# Patient Record
Sex: Male | Born: 1995 | Race: White | Hispanic: No | Marital: Single | Smoking: Current every day smoker
Health system: Southern US, Community
[De-identification: ages and names within clinical notes are randomized; demographics above are authoritative.]

---

## 2016-11-23 ENCOUNTER — Emergency Department (INDEPENDENT_AMBULATORY_CARE_PROVIDER_SITE_OTHER)
Admission: EM | Admit: 2016-11-23 | Discharge: 2016-11-23 | Disposition: A | Discharge status: Home / Self Care | Payer: Self-pay | Source: Home / Self Care | Attending: Family Medicine | Admitting: Family Medicine

## 2016-11-23 DIAGNOSIS — R5382 Chronic fatigue, unspecified: Secondary | ICD-10-CM

## 2016-11-23 DIAGNOSIS — K529 Noninfective gastroenteritis and colitis, unspecified: Secondary | ICD-10-CM

## 2016-11-23 DIAGNOSIS — R112 Nausea with vomiting, unspecified: Secondary | ICD-10-CM

## 2016-11-23 DIAGNOSIS — R197 Diarrhea, unspecified: Secondary | ICD-10-CM

## 2016-11-23 MED ORDER — ONDANSETRON 4 MG PO TBDP: ORAL_TABLET | ORAL | 0 refills | Status: AC

## 2016-11-23 NOTE — Discharge Instructions (Addendum)
Begin clear liquids (Pedialyte while having diarrhea) until improved, then advance to a BRAT diet (Bananas, Rice, Applesauce, Toast).  Then gradually resume a regular diet when tolerated.  Avoid milk products until well.  ° °If symptoms become significantly worse during the night or over the weekend, proceed to the local emergency room. ° °

## 2016-11-23 NOTE — ED Triage Notes (Signed)
Patient began with nausea and vomiting, and longer term diarrhea at about 0500 today; last episodes at 1000.

## 2016-11-23 NOTE — ED Triage Notes (Signed)
Patient came here after evaluation at [facility] ED. Their only concern and comment was his respirations. He was dissatisfied with his work up.

## 2016-11-24 LAB — COMPLETE METABOLIC PANEL WITH GFR
ALT: 12 U/L (ref 9–46)
AST: 15 U/L (ref 10–40)
Albumin: 4.9 g/dL (ref 3.6–5.1)
Alkaline Phosphatase: 56 U/L (ref 40–115)
BUN: 17 mg/dL (ref 7–25)
CALCIUM: 9.5 mg/dL (ref 8.6–10.3)
CHLORIDE: 102 mmol/L (ref 98–110)
CO2: 25 mmol/L (ref 20–31)
Creat: 0.84 mg/dL (ref 0.60–1.35)
GFR, Est African American: >89 mL/min (ref >=60)
GFR, Est Non African American: >89 mL/min (ref >=60)
GLUCOSE: 83 mg/dL (ref 65–99)
POTASSIUM: 3.5 mmol/L (ref 3.5–5.3)
SODIUM: 140 mmol/L (ref 135–146)
Total Bilirubin: 1.1 mg/dL (ref 0.2–1.2)
Total Protein: 7.3 g/dL (ref 6.1–8.1)

## 2016-11-24 LAB — TSH: TSH: 1.89 m[IU]/L (ref 0.40–4.50)

## 2016-11-24 LAB — HIV ANTIBODY (ROUTINE TESTING W REFLEX): HIV 1&2 Ab, 4th Generation: NONREACTIVE

## 2016-11-24 LAB — CBC WITH DIFFERENTIAL/PLATELET
Basophils Absolute: 0 cells/uL (ref 0–200)
Basophils Relative: 0 %
EOS PCT: 0 %
Eosinophils Absolute: 0 cells/uL — ABNORMAL LOW (ref 15–500)
HCT: 48.5 % (ref 38.5–50.0)
Hemoglobin: 17.1 g/dL (ref 13.2–17.1)
LYMPHS PCT: 14 %
Lymphs Abs: 1400 cells/uL (ref 850–3900)
MCH: 30.5 pg (ref 27.0–33.0)
MCHC: 35.3 g/dL (ref 32.0–36.0)
MCV: 86.5 fL (ref 80.0–100.0)
MPV: 9.8 fL (ref 7.5–12.5)
Monocytes Absolute: 700 cells/uL (ref 200–950)
Monocytes Relative: 7 %
NEUTROS PCT: 79 %
Neutro Abs: 7900 cells/uL — ABNORMAL HIGH (ref 1500–7800)
PLATELETS: 197 10*3/uL (ref 140–400)
RBC: 5.61 MIL/uL (ref 4.20–5.80)
RDW: 13.6 % (ref 11.0–15.0)
WBC: 10 10*3/uL (ref 3.8–10.8)

## 2016-11-24 LAB — SEDIMENTATION RATE: SED RATE: 1 mm/h (ref 0–15)

## 2016-11-25 LAB — GC/CHLAMYDIA PROBE AMP
CT PROBE, AMP APTIMA: NOT DETECTED
GC Probe RNA: NOT DETECTED

## 2016-11-25 LAB — RPR

## 2016-11-26 ENCOUNTER — Telehealth: Payer: Self-pay | Admitting: Emergency Medicine

## 2016-11-26 NOTE — Telephone Encounter (Signed)
Left VM to call for lab results. pk

## 2016-11-27 ENCOUNTER — Telehealth: Payer: Self-pay | Admitting: *Deleted

## 2016-11-27 NOTE — ED Provider Notes (Signed)
[facility]    CSN: [id number] Arrival date & time: 11/23/16  1939     History   Chief Complaint Chief Complaint  Patient presents with  . Emesis  . Diarrhea    HPI Tyler Grant is a 21 y.o. male.   Patient complains of feeling chronically fatigued for at least a year.  During the past 8 months he has also had chronic loose stools.  He also believes that he has lost weight.   During the past 3 to 4 days he has developed nausea and several episodes of vomiting, and his stools have become more loose.  He has had increased abdominal cramps without pain.  Denies recent foreign travel, or drinking untreated water in a wilderness environment.  He denies recent antibiotic use.    The history is provided by the patient.    No past medical history on file.  There are no active problems to display for this patient.   No past surgical history on file.     Home Medications    Prior to Admission medications   Medication Sig Start Date End Date Taking? Authorizing Provider  ondansetron (ZOFRAN ODT) 4 MG disintegrating tablet Take one tab by mouth Q6hr prn nausea.  Dissolve under tongue. 11/23/16   Lattie Haw, MD    Family History No family history on file.  Social History Social History  Substance Use Topics  . Smoking status: Not on file  . Smokeless tobacco: Not on file  . Alcohol use Not on file     Allergies   Patient has no known allergies.   Review of Systems Review of Systems  Constitutional: Positive for activity change, appetite change, fatigue and unexpected weight change. Negative for chills, diaphoresis and fever.  HENT: Negative.   Eyes: Negative.   Respiratory: Negative.   Cardiovascular: Negative.   Gastrointestinal: Positive for abdominal pain, diarrhea, nausea and vomiting. Negative for abdominal distention, blood in stool, constipation and rectal pain.  Endocrine: Negative.   Genitourinary: Negative.   Musculoskeletal:  Negative.   Skin: Negative.   Neurological: Negative.      Physical Exam Triage Vital Signs ED Triage Vitals  Enc Vitals Group     BP 11/23/16 2012 133/81     Pulse Rate 11/23/16 2012 86     Resp 11/23/16 2012 16     Temp 11/23/16 2012 99 F (37.2 C)     Temp Source 11/23/16 2012 Oral     SpO2 11/23/16 2012 99 %     Weight 11/23/16 2013 135 lb (61.2 kg)     Height 11/23/16 2013 5' 5.5" (1.664 m)     Head Circumference --      Peak Flow --      Pain Score 11/23/16 2013 1     Pain Loc --      Pain Edu? --      Excl. in GC? --    No data found.   Updated Vital Signs BP 133/81 (BP Location: Left Arm)   Pulse 86   Temp 99 F (37.2 C) (Oral)   Resp 16   Ht 5' 5.5" (1.664 m)   Wt 135 lb (61.2 kg)   SpO2 99%   BMI 22.12 kg/m   Visual Acuity Right Eye Distance:   Left Eye Distance:   Bilateral Distance:    Right Eye Near:   Left Eye Near:    Bilateral Near:     Physical Exam Nursing notes and Vital  Signs reviewed. Appearance:  Patient appears stated age, and in no acute distress Eyes:  Pupils are equal, round, and reactive to light and accomodation.  Extraocular movement is intact.  Conjunctivae are not inflamed  Ears:  Canals normal.  Tympanic membranes normal.  Nose:  Normal turbinates.  No sinus tenderness.    Pharynx:  Normal Neck:  Supple.  No thyromegaly or adenopathy. Lungs:  Clear to auscultation.  Breath sounds are equal.  Moving air well. Heart:  Regular rate and rhythm without murmurs, rubs, or gallops.  Abdomen:  Nontender without masses or hepatosplenomegaly.  Bowel sounds are present.  No CVA or flank tenderness.  Extremities:  No edema.  Skin:  No rash present.    UC Treatments / Results  Labs (all labs ordered are listed, but only abnormal results are displayed) Labs Reviewed  CBC WITH DIFFERENTIAL/PLATELET - Abnormal; Notable for the following:       Result Value   Neutro Abs 7,900 (*)    Eosinophils Absolute 0 (*)    All other  components within normal limits   Narrative:    Performed at:  [facility]                [address], [address]                [place], [place] [address]  GC/CHLAMYDIA PROBE AMP   Narrative:    Performed at:  [facility]                [address], [address]                [place], [place] [address]  COMPLETE METABOLIC PANEL WITH GFR   Narrative:    Performed at:  [facility]                [address], [address]                [place], [place] [address]  TSH   Narrative:    Performed at:  [facility]                [address], [address]                [place], [place] [address]  HIV ANTIBODY (ROUTINE TESTING)   Narrative:    Performed at:  [facility]                [address], [address]                [place], [place] [address]  RPR   Narrative:    Performed at:  [facility]                [address], [address]                [place], [place] [address]  SEDIMENTATION RATE   Narrative:    Performed at:  [facility]                [address]                [place], [place] [address]  HSV(HERPES SIMPLEX VRS) I + II AB-IGG    EKG  EKG Interpretation None       Radiology No results found.  Procedures Procedures (including critical care time)  Medications Ordered in UC Medications - No data to  display   Initial Impression / Assessment and Plan / UC Course  I have reviewed the triage vital signs and the nursing notes.  Pertinent labs & imaging results that were available during my care of the patient were reviewed by me and considered in my medical decision making (see chart for details).    Suspect that patient's current 3 to 4 day history of increased nausea/vomiting and abdominal bloating represents a viral gastroenteritis.  His chronic loose stools and recurring nausea should have further evaluation. Recommend follow-up with PCP to establish relationship and undergo  further evaluation. Will initiate following: CBC/diff, Sed Rate, CMP, TSH, RPR, HIV, HSV, and GC/chlamydia. Begin clear liquids (Pedialyte while having diarrhea) until improved, then advance to a [employer] (Bananas, Rice, Applesauce, Toast).  Then gradually resume a regular diet when tolerated.  Avoid milk products until well.   If symptoms become significantly worse during the night or over the weekend, proceed to the local emergency room.      Final Clinical Impressions(s) / UC Diagnoses   Final diagnoses:  Nausea vomiting and diarrhea  Chronic diarrhea of unknown origin  Chronic fatigue    New Prescriptions Discharge Medication List as of 11/23/2016  9:15 PM    START taking these medications   Details  ondansetron (ZOFRAN ODT) 4 MG disintegrating tablet Take one tab by mouth Q6hr prn nausea.  Dissolve under tongue., Print         Lattie Haw, MD 11/27/16 [phone]

## 2016-11-27 NOTE — Telephone Encounter (Signed)
Pt called password verified and lab results given. Awaiting HSV results.

## 2016-11-28 NOTE — Telephone Encounter (Signed)
Called [facility] to check on status of HSV. The test had not been performed but they did have the order. The tests is still able to be performed. TAT 2-3 days.

## 2016-11-29 ENCOUNTER — Telehealth: Payer: Self-pay | Admitting: *Deleted

## 2016-11-29 LAB — HSV(HERPES SIMPLEX VRS) I + II AB-IGG
HSV 1 Glycoprotein G Ab, IgG: <0.9 Index (ref <0.90)
HSV 2 Glycoprotein G Ab, IgG: 11.6 Index — ABNORMAL HIGH (ref <0.90)

## 2016-11-29 NOTE — Telephone Encounter (Signed)
Pt called for HSV results. Advised him HSV is pending. We called the lab yesterday, the test was not started, new order was requested and test is currently in process.

## 2016-11-30 ENCOUNTER — Telehealth: Payer: Self-pay | Admitting: Emergency Medicine

## 2016-11-30 NOTE — Telephone Encounter (Signed)
Gave patient HSV results and told him: After review of pt visit with Dr. Cathren Harsh, pt was not having symptoms c/w a current outbreak as no mention of rash during that visit. This positive result cannot distinguish between old or recent infection. If the patient is concerned this is a new exposure/infection with HSV and is having a painful rash, we can call in Valtrex. 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 Please encourage patient to practice safe sex by using a condom, however, this is not 100% risk free either. Please encourage patient to notify partners of this. F/u with PCP as needed.

## 2017-06-15 ENCOUNTER — Emergency Department (INDEPENDENT_AMBULATORY_CARE_PROVIDER_SITE_OTHER)
Admission: EM | Admit: 2017-06-15 | Discharge: 2017-06-15 | Disposition: A | Discharge status: Nursing Home (Includes ICF) | Payer: Worker's Compensation | Source: Home / Self Care | Attending: Family Medicine | Admitting: Family Medicine

## 2017-06-15 ENCOUNTER — Emergency Department (INDEPENDENT_AMBULATORY_CARE_PROVIDER_SITE_OTHER): Payer: Worker's Compensation

## 2017-06-15 ENCOUNTER — Encounter: Payer: Self-pay | Admitting: Emergency Medicine

## 2017-06-15 DIAGNOSIS — S65511A Laceration of blood vessel of left index finger, initial encounter: Secondary | ICD-10-CM | POA: Diagnosis not present

## 2017-06-15 DIAGNOSIS — S61211A Laceration without foreign body of left index finger without damage to nail, initial encounter: Secondary | ICD-10-CM

## 2017-06-15 DIAGNOSIS — W25XXXA Contact with sharp glass, initial encounter: Secondary | ICD-10-CM

## 2017-06-15 MED ORDER — TETANUS-DIPHTH-ACELL PERTUSSIS 5-2.5-18.5 LF-MCG/0.5 IM SUSP: 0.5000 mL | Freq: Once | INTRAMUSCULAR | Status: DC

## 2017-06-15 MED ORDER — IBUPROFEN 800 MG PO TABS: 800.0000 mg | ORAL_TABLET | Freq: Once | ORAL | Status: DC

## 2017-06-15 NOTE — Discharge Instructions (Signed)
°  On further exam, it appears you have nicked a small artery in your finger.  It is important to be evaluated and treated in the emergency department.  Let them know our concerns.  Your x-ray here was normal. Please show them a copy of the results so they do not repeat the x-ray.  You should have them update your tetanus at the hospital.

## 2017-06-15 NOTE — ED Provider Notes (Signed)
[facility]    CSN: [id number] Arrival date & time: 06/15/17  1932     History   Chief Complaint Chief Complaint  Patient presents with  . Hand Injury    left index finger    HPI Tyler Grant is a 21 y.o. male.   HPI  Tyler Grant is a 21 y.o. male presenting to UC with c/o laceration on his Left index finger that occurred just PTA. Pt works at [employer] and was attempting to catch a glass that was falling. As he caught the glass, it shattered in his hand.  Pt concerned he may have cut down to the bone. Pt states blood was pouring out of his hand at the restaurant. He was able to slow the bleeding by wrapping his hand in a towel.  He is Right hand dominant. He believes his last tetanus was about 7 years ago.  He is not on blood thinners.    History reviewed. No pertinent past medical history.  There are no active problems to display for this patient.   History reviewed. No pertinent surgical history.     Home Medications    Prior to Admission medications   Medication Sig Start Date End Date Taking? Authorizing Provider  ondansetron (ZOFRAN ODT) 4 MG disintegrating tablet Take one tab by mouth Q6hr prn nausea.  Dissolve under tongue. 11/23/16   Lattie Haw, MD    Family History History reviewed. No pertinent family history.  Social History Social History  Substance Use Topics  . Smoking status: Current Every Day Smoker  . Smokeless tobacco: Never Used  . Alcohol use Not on file     Allergies   Patient has no known allergies.   Review of Systems Review of Systems  Musculoskeletal: Negative for arthralgias.  Skin: Positive for wound. Negative for color change.  Neurological: Positive for numbness (mild in Left index finger). Negative for weakness.     Physical Exam Triage Vital Signs ED Triage Vitals [06/15/17 1954]  Enc Vitals Group     BP (!) 153/85     Pulse Rate 76     Resp 18     Temp 98.2 F (36.8 C)     Temp Source  Oral     SpO2      Weight      Height      Head Circumference      Peak Flow      Pain Score      Pain Loc      Pain Edu?      Excl. in GC?    No data found.   Updated Vital Signs BP (!) 153/85 (BP Location: Right Arm)   Pulse 76   Temp 98.2 F (36.8 C) (Oral)   Resp 18   Ht 5\' 7"  (1.702 m)   Wt 140 lb (63.5 kg)   BMI 21.93 kg/m      Physical Exam  Constitutional: He is oriented to person, place, and time. He appears well-developed and well-nourished. No distress.  HENT:  Head: Normocephalic and atraumatic.  Eyes: EOM are normal.  Neck: Normal range of motion.  Cardiovascular: Normal rate.   Pulmonary/Chest: Effort normal.  Musculoskeletal: Normal range of motion. He exhibits edema (mild edema proximal Left index finger) and tenderness.  Left index finger: mild tenderness at proximal aspect, last laceration, slight decreased ROM due to pain and swelling.   Neurological: He is alert and oriented to person, place, and time.  Skin:  Skin is warm and dry. He is not diaphoretic.  Left index finger: 4cm deep near circumferential laceration along ulnar aspect or proximal phalanx.  Adipose tissue exposed. Bleeding controlled. No tendons or bones visualized. No foreign bodies visualized.   Psychiatric: He has a normal mood and affect. His behavior is normal.  Nursing note and vitals reviewed.    UC Treatments / Results  Labs (all labs ordered are listed, but only abnormal results are displayed) Labs Reviewed - No data to display  EKG  EKG Interpretation None       Radiology Dg Hand Complete Left  Result Date: 06/15/2017 CLINICAL DATA:  Laceration left index finger. EXAM: LEFT HAND - COMPLETE 3+ VIEW COMPARISON:  None. FINDINGS: There is a bandage overlying the index finger. Underlying bony structures are within normal without fracture or dislocation. No radiopaque foreign body. IMPRESSION: No acute fracture. Electronically Signed   By: Elberta Fortisaniel  Boyle M.D.   On:  06/15/2017 20:08    Procedures Procedures (including critical care time)  Medications Ordered in UC Medications  Tdap (BOOSTRIX) injection 0.5 mL (not administered)  ibuprofen (ADVIL,MOTRIN) tablet 800 mg (not administered)     Initial Impression / Assessment and Plan / UC Course  I have reviewed the triage vital signs and the nursing notes.  Pertinent labs & imaging results that were available during my care of the patient were reviewed by me and considered in my medical decision making (see chart for details).     Pt sent to imaging to r/o foreign bodies (shattered glass) or bony injury.   During trip to imaging, wound started profusely bleeding, controlled with direct pressure. Imaging able to be performed. Normal plain films of Left hand/index finger.  Upon arrival back to procedure room, dressing removed, bleeding started again with slight pulsation of visualized blood vessel that was not seen on initial exam. Concern pt nicked an artery. Recommend pt go to emergency department for further evaluation and treatment. Pt agreeable and understanding of treatment plan.   Wound rewrapped to control bleeding. Hand elevated. Pt discharged from UC to [facility]     Final Clinical Impressions(s) / UC Diagnoses   Final diagnoses:  Laceration of blood vessel of left index finger, initial encounter    New Prescriptions New Prescriptions   No medications on file     Controlled Substance Prescriptions  Controlled Substance Registry consulted? Not Applicable   Rolla Platehelps, Robbin Escher O, PA-C 06/15/17 2035

## 2017-06-15 NOTE — ED Triage Notes (Signed)
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

## 2017-06-17 ENCOUNTER — Telehealth: Payer: Self-pay | Admitting: Emergency Medicine

## 2017-06-17 NOTE — Telephone Encounter (Signed)
Patient states his hand finger has been stitched and splinted and [facility]

## 2018-05-29 IMAGING — DX DG HAND COMPLETE 3+V*L*
3 series · 3 of 3 positions shown · non-contrast
Comparison: None.

CLINICAL DATA: Laceration left index finger.

EXAM:
LEFT HAND - COMPLETE 3+ VIEW

[hand obl (1 of 2)]
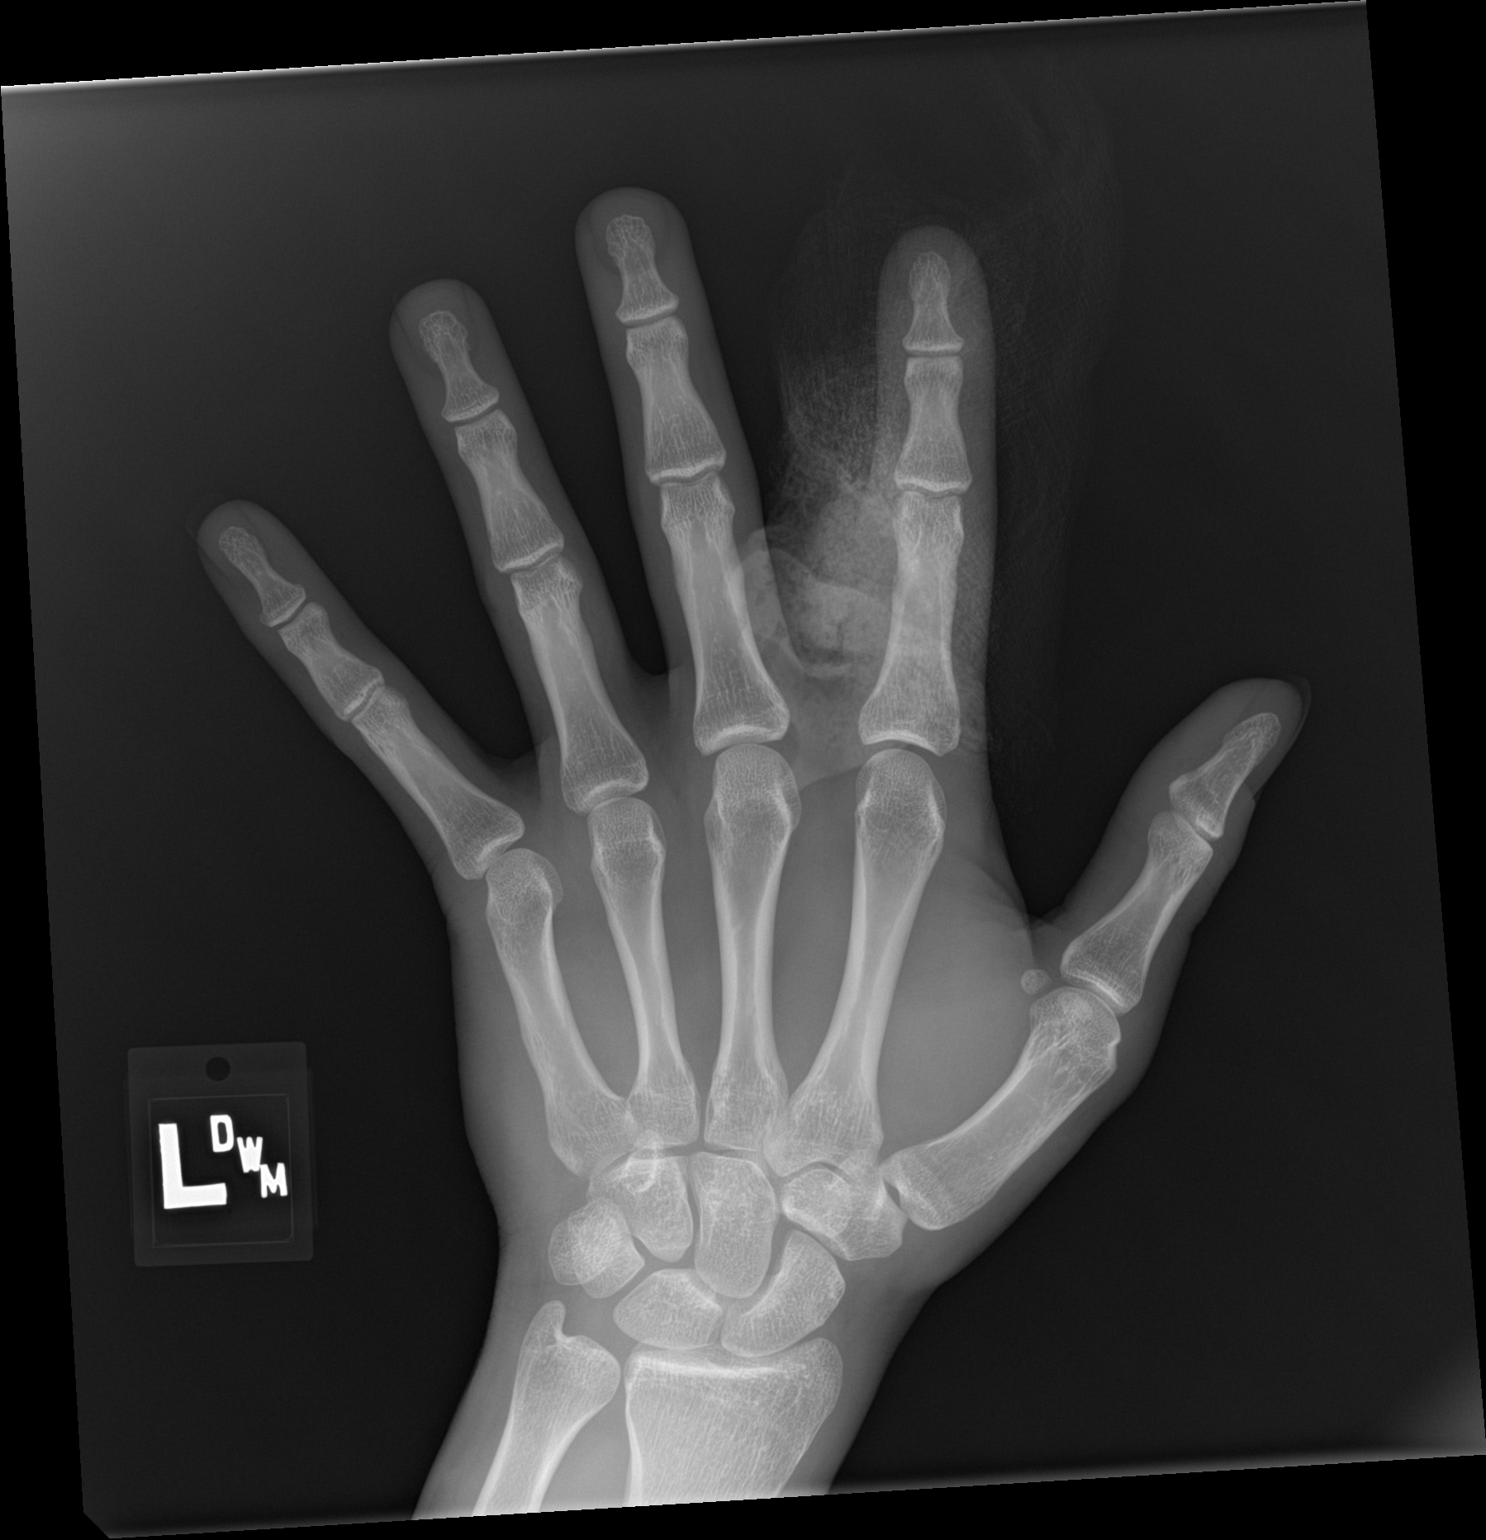

[hand lat]
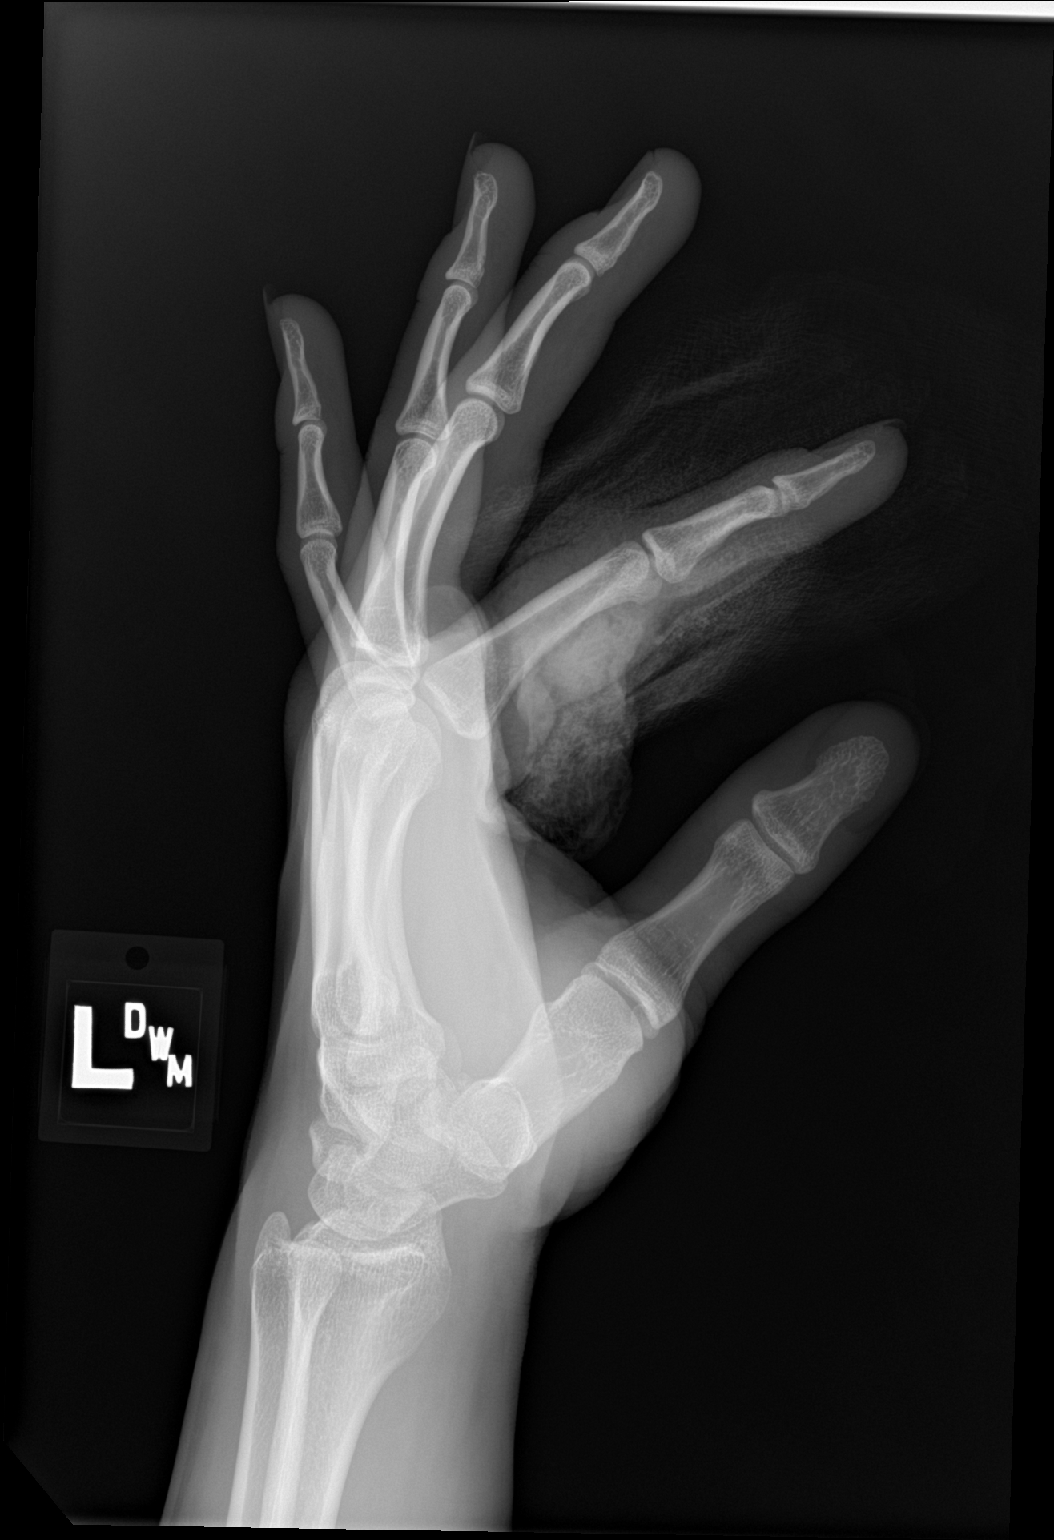

[hand obl (2 of 2)]
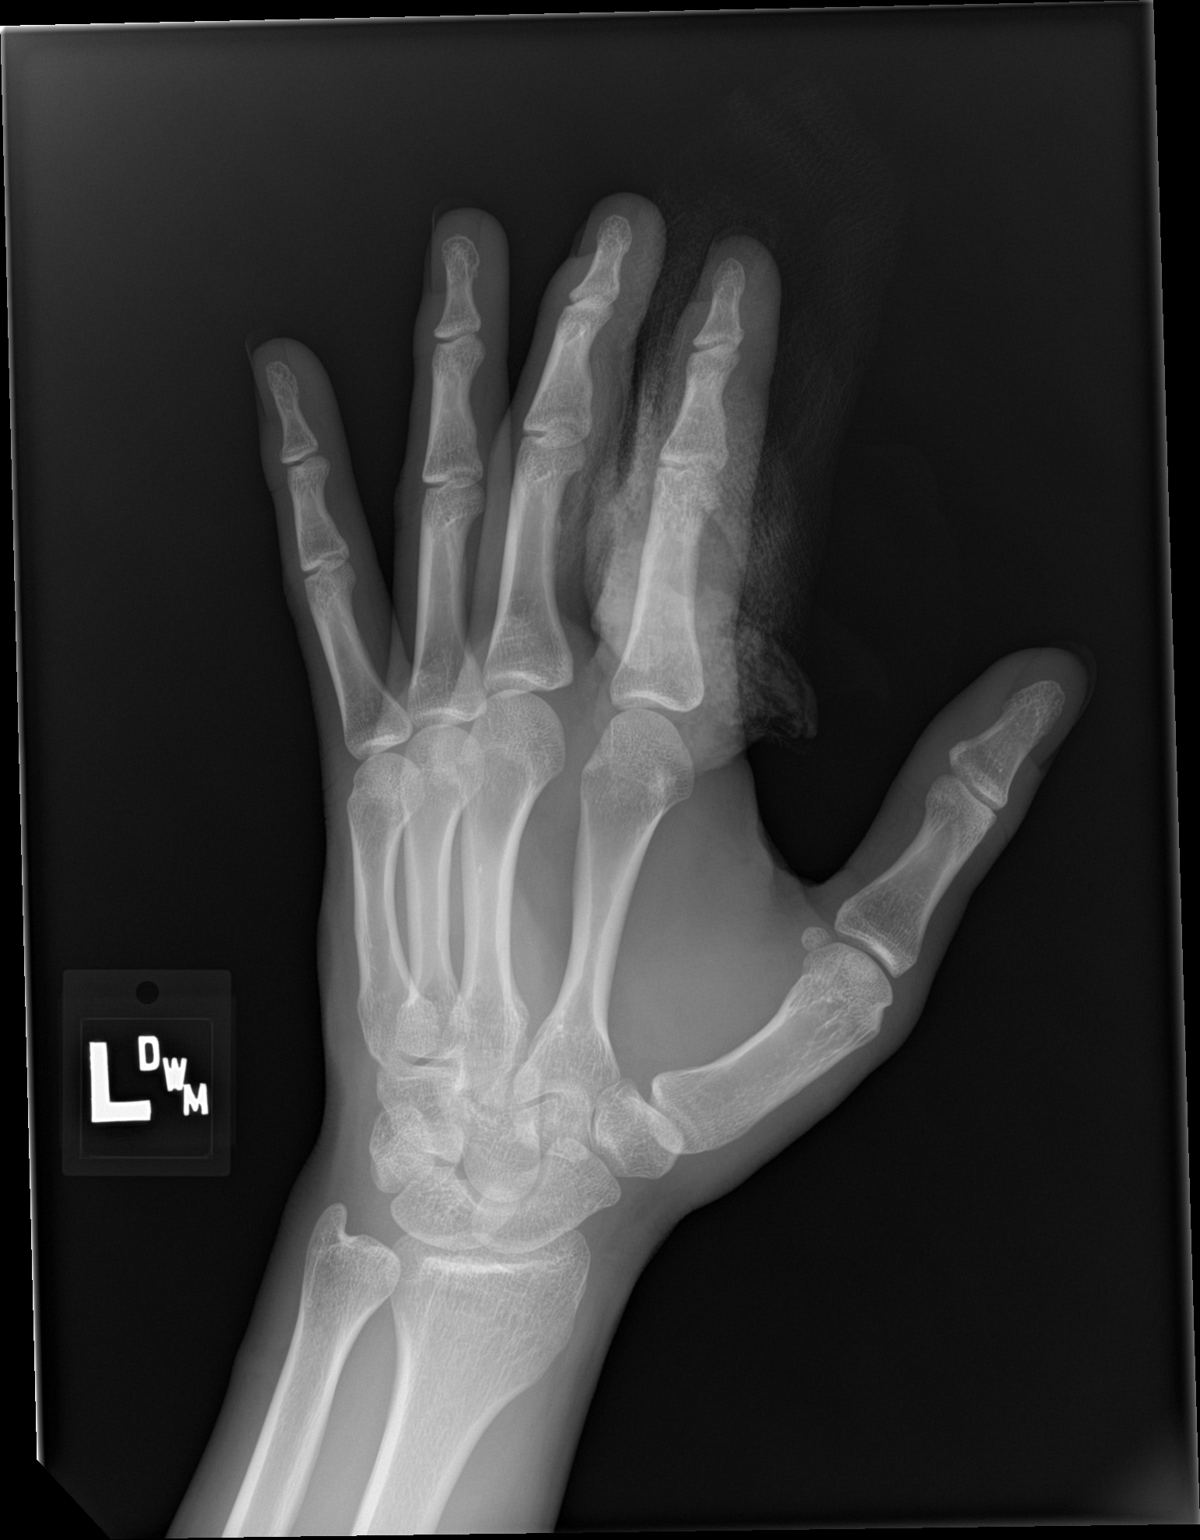

[3 of 3 positions shown; findings below may reference images not displayed]

FINDINGS: There is a bandage overlying the index finger. Underlying bony
structures are within normal without fracture or dislocation. No
radiopaque foreign body.
IMPRESSION: No acute fracture.
